# Patient Record
Sex: Male | Born: 1970 | Race: Black or African American | Hispanic: No | Marital: Single | Smoking: Never smoker
Health system: Southern US, Community
[De-identification: ages and names within clinical notes are randomized; demographics above are authoritative.]

## PROBLEM LIST (undated history)

## (undated) DIAGNOSIS — I10 Essential (primary) hypertension: Secondary | ICD-10-CM

## (undated) DIAGNOSIS — I480 Paroxysmal atrial fibrillation: Secondary | ICD-10-CM

## (undated) HISTORY — DX: Paroxysmal atrial fibrillation: I48.0

---

## 2016-04-19 ENCOUNTER — Emergency Department: Payer: 59

## 2016-04-19 ENCOUNTER — Encounter: Payer: Self-pay | Admitting: Medical Oncology

## 2016-04-19 ENCOUNTER — Emergency Department
Admission: EM | Admit: 2016-04-19 | Discharge: 2016-04-19 | Disposition: A | Discharge status: Home / Self Care | Payer: 59 | Attending: Emergency Medicine | Admitting: Emergency Medicine

## 2016-04-19 DIAGNOSIS — I10 Essential (primary) hypertension: Secondary | ICD-10-CM | POA: Diagnosis not present

## 2016-04-19 DIAGNOSIS — R079 Chest pain, unspecified: Secondary | ICD-10-CM | POA: Insufficient documentation

## 2016-04-19 DIAGNOSIS — Z79899 Other long term (current) drug therapy: Secondary | ICD-10-CM | POA: Diagnosis not present

## 2016-04-19 HISTORY — DX: Essential (primary) hypertension: I10

## 2016-04-19 LAB — CBC
HCT: 39.7 % — ABNORMAL LOW (ref 40.0–52.0)
HEMOGLOBIN: 13.2 g/dL (ref 13.0–18.0)
MCH: 26.5 pg (ref 26.0–34.0)
MCHC: 33.1 g/dL (ref 32.0–36.0)
MCV: 80 fL (ref 80.0–100.0)
Platelets: 263 10*3/uL (ref 150–440)
RBC: 4.96 MIL/uL (ref 4.40–5.90)
RDW: 13.6 % (ref 11.5–14.5)
WBC: 5 10*3/uL (ref 3.8–10.6)

## 2016-04-19 LAB — BASIC METABOLIC PANEL
Anion gap: 8 (ref 5–15)
BUN: 17 mg/dL (ref 6–20)
CHLORIDE: 103 mmol/L (ref 101–111)
CO2: 27 mmol/L (ref 22–32)
Calcium: 9.5 mg/dL (ref 8.9–10.3)
Creatinine, Ser: 1.34 mg/dL — ABNORMAL HIGH (ref 0.61–1.24)
GFR calc non Af Amer: >60 mL/min (ref >60)
Glucose, Bld: 93 mg/dL (ref 65–99)
Potassium: 3.7 mmol/L (ref 3.5–5.1)
Sodium: 138 mmol/L (ref 135–145)

## 2016-04-19 LAB — TROPONIN I
Troponin I: <0.03 ng/mL (ref <0.03)
Troponin I: <0.03 ng/mL (ref <0.03)

## 2016-04-19 MED ORDER — OXYCODONE-ACETAMINOPHEN 5-325 MG PO TABS
1.0000 | ORAL_TABLET | Freq: Once | ORAL | Status: AC
  Administered 2016-04-19: 1 via ORAL
  Filled 2016-04-19: qty 1

## 2016-04-19 NOTE — ED Provider Notes (Signed)
[facility] Emergency Department Provider Note  ____________________________________________   First MD Initiated Contact with Patient 04/19/16 1111     (approximate)  I have reviewed the triage vital signs and the nursing notes.   HISTORY  Chief Complaint Chest Pain and Headache   HPI Aaron Tucker is a 46 y.o. male With a history of atrial fibrillation on Xarelto who is presenting to the emergency department today with left-sided chest pain.He says the pain started when he was shoveling dirt while "digging a line." He says the pain is sharp and a 6 out of 10 at this time. He says that it is associated with shortness of breath and radiates to the left upper arm. He says that he has had intermittent episodes of this type of pain as well as intermittent headaches over the past year. He says that he is a strong history of heart disease in his family including people including people who have heart attacks their 2740s.  He patient is denying headache at this time. Denying nausea at this time.Says that he has taken Xarelto up until yesterday but did not take his morning dose because he just ran out of medication.says the pain started at about 9 AM this morning.does not report pain worsening with exertion.   Past Medical History:  Diagnosis Date  . A-fib (HCC)   . Hypertension     There are no active problems to display for this patient.   No past surgical history on file.  Prior to Admission medications   Medication Sig Start Date End Date Taking? Authorizing Provider  amLODipine (NORVASC) 10 MG tablet Take 10 mg by mouth daily. 03/12/16  Yes Historical Provider, MD  carvedilol (COREG) 12.5 MG tablet Take 12.5 mg by mouth 2 (two) times daily with a meal. 03/13/16  Yes Historical Provider, MD  lisinopril-hydrochlorothiazide (PRINZIDE,ZESTORETIC) 20-25 MG tablet Take 1 tablet by mouth daily. 03/12/16  Yes Historical Provider, MD  XARELTO 20 MG TABS tablet Take 20 mg by  mouth daily. 03/14/16  Yes Historical Provider, MD    Allergies Patient has no known allergies.  No family history on file.  Social History Social History  Substance Use Topics  . Smoking status: Not on file  . Smokeless tobacco: Not on file  . Alcohol use Not on file    Review of Systems Constitutional: No fever/chills Eyes: No visual changes. ENT: No sore throat. Cardiovascular: as above Respiratory: as above Gastrointestinal: No abdominal pain.  No nausea, no vomiting.  No diarrhea.  No constipation. Genitourinary: Negative for dysuria. Musculoskeletal: Negative for back pain. Skin: Negative for rash. Neurological: Negative for headaches, focal weakness or numbness.  10-point ROS otherwise negative.  ____________________________________________   PHYSICAL EXAM:  VITAL SIGNS: ED Triage Vitals  Enc Vitals Group     BP 04/19/16 1025 129/82     Pulse Rate 04/19/16 1025 70     Resp 04/19/16 1025 20     Temp 04/19/16 1025 98.3 F (36.8 C)     Temp Source 04/19/16 1025 Oral     SpO2 04/19/16 1025 99 %     Weight 04/19/16 1026 230 lb (104.3 kg)     Height 04/19/16 1026 5\' 10"  (1.778 m)     Head Circumference --      Peak Flow --      Pain Score 04/19/16 1029 6     Pain Loc --      Pain Edu? --      Excl.  in GC? --     Constitutional: Alert and oriented. Well appearing and in no acute distress. Eyes: Conjunctivae are normal. PERRL. EOMI. Head: Atraumatic. Nose: No congestion/rhinnorhea. Mouth/Throat: Mucous membranes are moist.   Neck: No stridor.   Cardiovascular: Normal rate, regular rhythm. Grossly normal heart sounds.  Good peripheral circulation with equal and intact bilaterally radial pulses. Chest pain is reproducible palpation over the left pectoralis major muscle. Respiratory: Normal respiratory effort.  No retractions. Lungs CTAB. Gastrointestinal: Soft and nontender. No distention.  Musculoskeletal: No lower extremity tenderness nor edema.  No joint  effusions. Neurologic:  Normal speech and language. No gross focal neurologic deficits are appreciated Skin:  Skin is warm, dry and intact. No rash noted. Psychiatric: Mood and affect are normal. Speech and behavior are normal.  ____________________________________________   LABS (all labs ordered are listed, but only abnormal results are displayed)  Labs Reviewed  BASIC METABOLIC PANEL - Abnormal; Notable for the following:       Result Value   Creatinine, Ser 1.34 (*)    All other components within normal limits  CBC - Abnormal; Notable for the following:    HCT 39.7 (*)    All other components within normal limits  TROPONIN I  TROPONIN I   ____________________________________________  EKG  ED ECG REPORT I, Arelia Longest, the attending physician, personally viewed and interpreted this ECG.   Date: 04/19/2016  EKG Time: 1021  Rate: 68  Rhythm: normal sinus rhythm  Axis: normal  Intervals:none  ST&T Change: Mildly elevated ST segments diffusely consistent with early repolarization. No abnormal T-wave inversions.  ____________________________________________  RADIOLOGY  DG Chest 2 View (Final result)  Result time 04/19/16 10:53:11  Final result by Richarda Overlie, MD (04/19/16 10:53:11)           Narrative:   CLINICAL DATA: Headache and chest pain.  EXAM: CHEST 2 VIEW  COMPARISON: None.  FINDINGS: Both lungs are clear. Heart and mediastinum are within normal limits. The trachea is midline. Bony thorax is intact.  IMPRESSION: No active cardiopulmonary disease.   Electronically Signed By: Richarda Overlie M.D. On: 04/19/2016 10:53            ____________________________________________   PROCEDURES  Procedure(s) performed:   Procedures  Critical Care performed:   ____________________________________________   INITIAL IMPRESSION / ASSESSMENT AND PLAN / ED COURSE  Pertinent labs & imaging results that were available during my care  of the patient were reviewed by me and considered in my medical decision making (see chart for details).  PERC negative.    Clinical Course as of Apr 19 1499  Thu Apr 19, 2016  1339 Patient with relief of pain after Percocet. Very reassuring first round of lab testing. We will check a second troponin. If negative, the patient will be discharged home with cardiology follow-up. I also recommended Aspercreme and I see hot for further local relief of the pain which appears to be chest wall pain. However, given the patient's concerning personal cardiac history as well as family history he will be referred for outpatient follow-up with cardiology.  [DS]    Clinical Course User Index [DS] Myrna Blazer, MD   ----------------------------------------- 3:02 PM on 04/19/2016 -----------------------------------------  Patient is still feeling much improved. Very reassuring workup including a normal EKG and 2 negative troponins. Tenderness to the chest wall and patient taking a ditch when this pain started. Feel this is most likely a chest wall pain. However, the patient does not a  cardiologist and is on anticoagulants for atrial fibrillation. I'll give him follow-up with the on-call cardiologist. He knows he must follow-up within the next 72 hours. Patient understands this plan and is willing to comply. We also discussed using Aspercreme or icy hot to the affected area for further pain relief.  ____________________________________________   FINAL CLINICAL IMPRESSION(S) / ED DIAGNOSES  Chest pain.    NEW MEDICATIONS STARTED DURING THIS VISIT:  New Prescriptions   No medications on file     Note:  This document was prepared using Dragon voice recognition software and may include unintentional dictation errors.    Myrna Blazer, MD 04/19/16 [phone]

## 2016-04-19 NOTE — ED Notes (Signed)
Patient sitting in waiting room, using his phone.  Denies any new complaints at this time.  Advised to let us know if his symptoms worsened.

## 2016-04-19 NOTE — ED Triage Notes (Signed)
Pt reports that he has been having left sided chest pain for over a year with headaches off and on. Pt reports feeling sob when pain hits.

## 2016-05-03 DIAGNOSIS — I480 Paroxysmal atrial fibrillation: Secondary | ICD-10-CM

## 2016-05-03 HISTORY — DX: Paroxysmal atrial fibrillation: I48.0

## 2016-05-03 HISTORY — PX: NM MYOVIEW LTD: HXRAD82

## 2016-07-03 HISTORY — PX: LEFT HEART CATH AND CORONARY ANGIOGRAPHY: CATH118249

## 2018-01-14 ENCOUNTER — Telehealth: Payer: Self-pay

## 2018-01-14 NOTE — Telephone Encounter (Signed)
SENT REFERRAL TO SCHEDULING, NO NOTES 

## 2018-02-17 ENCOUNTER — Ambulatory Visit: Payer: 59 | Admitting: Cardiology

## 2018-02-17 ENCOUNTER — Encounter: Payer: Self-pay | Admitting: Cardiology

## 2018-02-17 VITALS — BP 130/80 | HR 69 | Ht 70.0 in | Wt 228.4 lb

## 2018-02-17 DIAGNOSIS — I480 Paroxysmal atrial fibrillation: Secondary | ICD-10-CM | POA: Diagnosis not present

## 2018-02-17 DIAGNOSIS — R9431 Abnormal electrocardiogram [ECG] [EKG]: Secondary | ICD-10-CM

## 2018-02-17 DIAGNOSIS — R0789 Other chest pain: Secondary | ICD-10-CM | POA: Insufficient documentation

## 2018-02-17 DIAGNOSIS — I10 Essential (primary) hypertension: Secondary | ICD-10-CM

## 2018-02-17 MED ORDER — NITROGLYCERIN 0.4 MG SL SUBL: 0.4000 mg | SUBLINGUAL_TABLET | SUBLINGUAL | 3 refills | Status: AC | PRN

## 2018-02-17 NOTE — Progress Notes (Signed)
PCP: Patient, No Pcp Per   Ivonne Andrew, FNP -- Dorothy Puffer  [phone]   Clinic Note: Chief Complaint  Patient presents with  . New Patient (Initial Visit)    Establish cardiology care.  . Chest Pain    LEFT SIDED  . Atrial Fibrillation    History (March 2018)    HPI:  Aaron Tucker is a 47 y.o. male with a past medical history of hypertension and paroxysmal A. fib who is being seen today for the evaluation of Left Sided Chest Pain at the request of Touchstone, Eugenio Hoes, FNP (from [place]).  Aaron Tucker was referred by Ms. Touchstone, FNP -- Aaron Tucker PCP down in [place].   Recent Hospitalizations:   None since Feb 2019 -admitted ([facility]) for chest pain evaluation.  Eventually had a Myoview stress test followed by cardiac catheterization (see below).  Studies Personally Reviewed - (if available, images/films reviewed: From Epic Chart or Care Everywhere)  Myoview 05/2016 [facility]): Mild inducible perfusion defect of the anterior and anteroseptal wall.  Sub-target heart rate.  CATH 07/2016 - ([facility]) Normal Coronary Arteries.  High LVEDP.  (False Positive Stress Test)  Interval History: Aaron Tucker presents here today for evaluation of episodic sharp left-sided chest pain that can come and go at any times of the day.  Not necessarily associate with any particular activity.  Aaron Tucker describes it as a sharp pain that can last 10 seconds to 10 minutes.  Last episode Aaron Tucker had was about 10 weeks ago.  (Aaron Tucker girlfriend indicates that these are associated with him being sweaty and short of breath and and just not looking well.  Aaron Tucker himself really does not describe this).  Symptoms are not made worse by getting up and walking around or with any kind of exertion.  Aaron Tucker denies any sensation of rapid irregular heartbeats with these episodes to suggest that this is related to A. fib.  Aaron Tucker is familiar with Aaron Tucker A. fib symptoms and has not noted any recurrence of that for a while.  Aaron Tucker said Aaron Tucker was  diagnosed with A. fib in [place] prior to Aaron Tucker hospitalization [facility] for chest pain.  The chest pain Aaron Tucker was evaluated for last year was very similar to the current symptoms.  Aaron Tucker denies any PND, orthopnea or edema.  No syncope/near syncope or TIA/amaurosis fugax.  No melena, hematochezia, hematuria, or epstaxis. No claudication.  Aaron Tucker has not had a breakthrough episode of A. fib since the first diagnosis.  Aaron Tucker is been on aspirin and beta-blocker.  Aaron Tucker denies having had a echo or stress test done at the time of Aaron Tucker diagnosis, but did have a stress test and cath done shortly after and a different institution.  ROS: A comprehensive was performed.  Pertinent symptoms noted above Review of Systems  Constitutional: Negative for malaise/fatigue and weight loss.  HENT: Negative for congestion.   Respiratory: Positive for shortness of breath (Chest pain spells can take Aaron Tucker breath away, but otherwise no obvious dyspnea.). Negative for cough and wheezing.   Cardiovascular: Negative for claudication.  Gastrointestinal: Negative for abdominal pain, heartburn, nausea and vomiting.  Musculoskeletal: Positive for joint pain and myalgias (Occasional nighttime cramping).  Neurological: Negative for dizziness, focal weakness, weakness and headaches.  Psychiatric/Behavioral: Negative.   All other systems reviewed and are negative.  I have reviewed and (if needed) personally updated the patient's problem list, medications, allergies, past medical and surgical history, social and family history.   Past Medical History:  Diagnosis Date  .  Hypertension   . Paroxysmal atrial fibrillation (HCC) 05/2016    Past Surgical History:  Procedure Laterality Date  . LEFT HEART CATH AND CORONARY ANGIOGRAPHY  07/2016   [facility] - normal coronaries.  Normal EF.  Elevated LVEDP  . NM MYOVIEW LTD  05/2016   [facility] - Abnormal - ? Anterior, Anteroseptal Ischemia --> FALSE POSITIVE (normal cors on cath)    Current Meds    Medication Sig  . amLODipine (NORVASC) 10 MG tablet Take 10 mg by mouth daily.  . carvedilol (COREG) 12.5 MG tablet Take 12.5 mg by mouth 2 (two) times daily with a meal.  . lisinopril-hydrochlorothiazide (PRINZIDE,ZESTORETIC) 20-25 MG tablet Take 1 tablet by mouth daily.    No Known Allergies  Social History   Tobacco Use  . Smoking status: Never Smoker  . Smokeless tobacco: Never Used  Substance Use Topics  . Alcohol use: Not on file  . Drug use: Not on file   Social History   Social History Narrative   Aaron Tucker lives with Aaron Tucker girlfriend.  The moved from [place] about 2 years ago, but Aaron Tucker has been going back and forth to [place] for Aaron Tucker PCP evaluations.   Aaron Tucker has 4 children.     Drinks about a 12 pack a week.     Aaron Tucker does a lot of exercise/activity at work, but does not do routine exercise.    family history includes Asthma in Aaron Tucker brother, daughter, sister, and son; Diabetes in Aaron Tucker mother; High blood pressure in Aaron Tucker mother.  Wt Readings from Last 3 Encounters:  02/17/18 228 lb 6.4 oz (103.6 kg)  04/19/16 230 lb (104.3 kg)    PHYSICAL EXAM BP 130/80 (BP Location: Right Arm, Patient Position: Sitting, Cuff Size: Large)   Pulse 69   Ht 5\' 10"  (1.778 m)   Wt 228 lb 6.4 oz (103.6 kg)   BMI 32.77 kg/m  Physical Exam  Constitutional: Aaron Tucker is oriented to person, place, and time. Aaron Tucker appears well-developed and well-nourished. No distress.  Healthy-appearing.  Well-groomed.  Mildly obese.  HENT:  Head: Normocephalic and atraumatic.  Mouth/Throat: Oropharynx is clear and moist.  Eyes: Pupils are equal, round, and reactive to light. Conjunctivae and EOM are normal.  Neck: Neck supple. No hepatojugular reflux and no JVD present. Carotid bruit is not present. No tracheal deviation present. No thyromegaly present.  Cardiovascular: Normal rate, regular rhythm, S1 normal, S2 normal, intact distal pulses and normal pulses.  No extrasystoles are present. PMI is not displaced. Exam  reveals gallop, S4 (Cannot exclude soft S4) and distant heart sounds.  No murmur heard. Pulmonary/Chest: Effort normal and breath sounds normal. No respiratory distress. Aaron Tucker has no wheezes. Aaron Tucker has no rales. Aaron Tucker exhibits tenderness (Definitely has tenderness along the left sternal border.  Reproducible pain on exam).  Abdominal: Soft. Bowel sounds are normal. Aaron Tucker exhibits no distension. There is no abdominal tenderness. There is no rebound.  Obese.  Unable to assess HSM  Musculoskeletal: Normal range of motion.        General: No edema.  Neurological: Aaron Tucker is alert and oriented to person, place, and time. No cranial nerve deficit.  Skin: Skin is warm and dry. No rash noted. No erythema.  Psychiatric: Aaron Tucker has a normal mood and affect. Aaron Tucker behavior is normal. Judgment and thought content normal.  Vitals reviewed.    Adult ECG Report  Rate: 69 ;  Rhythm: normal sinus rhythm and LVH with repolarization changes consistent;   Narrative Interpretation: Borderline abnormal EKG  Other studies Reviewed: Additional studies/ records that were reviewed today include:  Recent Labs:  None available    ASSESSMENT / PLAN: Problem List Items Addressed This Visit    Abnormal EKG (Chronic)    LVH with repolarization noted on EKG.  Would like to get a baseline assessment of Aaron Tucker cardiac function and chamber sizes.  Need to exclude hypertensive hypertrophic cardiomyopathy.  Plan: 2D echo      Relevant Orders   EKG 12-Lead (Completed)   Comprehensive metabolic panel (Completed)   Lipid panel (Completed)   Hemoglobin A1c (Completed)   ECHOCARDIOGRAM COMPLETE   Chest wall pain - Primary    Sharp left-sided chest pain which is somewhat reproducible on exam.  Does not sound cardiac in nature. However Aaron Tucker does have some and more findings on Aaron Tucker EKG, therefore with relatively young gentleman with hypertension and A. fib, will check an echocardiogram just to assess Aaron Tucker EF and for any evidence of potential  hypertrophic cardiomyopathy. . Interestingly, Aaron Tucker girlfriend made more of an issue of Aaron Tucker chest pain.  She stated that these episodes last about 5 to 10 minutes and Aaron Tucker becomes sweaty and clammy.  As such, we will provide prescription for nitroglycerin to see if this would help. These were similar symptoms that were evaluated last year with a stress test which was false positive and a catheter was negative.  I am reluctant to do another ischemic evaluation for similar symptoms.      Relevant Orders   EKG 12-Lead (Completed)   Comprehensive metabolic panel (Completed)   Lipid panel (Completed)   Hemoglobin A1c (Completed)   Essential hypertension (Chronic)    Blood pressures pretty well controlled on current dose of amlodipine, carvedilol and lisinopril-HCTZ.  Refill meds. We will check chemistry panel.      Relevant Medications   nitroGLYCERIN (NITROSTAT) 0.4 MG SL tablet   Other Relevant Orders   EKG 12-Lead (Completed)   Comprehensive metabolic panel (Completed)   Lipid panel (Completed)   Hemoglobin A1c (Completed)   ECHOCARDIOGRAM COMPLETE   PAF (paroxysmal atrial fibrillation) (HCC) (Chronic)    History of PAF, but as far as Aaron Tucker can tell, no recurrence since March or May 2018.  Aaron Tucker did have some chest discomfort episodes when Aaron Tucker had A. fib, and therefore would probably know when Aaron Tucker is gone in and out of it.  With no obvious evidence of recurrence, I think were probably safer just doing baby aspirin for stroke prophylaxis.  Continue beta-blocker at current dose for rate control. We could consider pill in the pocket if Aaron Tucker has recurrence.  This patients CHA2DS2-VASc Score and unadjusted Ischemic Stroke Rate (% per year) is equal to 0.6 % stroke rate/year from a score of 1  Above score calculated as 1 point each if present [CHF, HTN, DM, Vascular=MI/PAD/Aortic Plaque, Age if 65-74, or Male]; 2 points each if present [Age > 75, or Stroke/TIA/TE]       Relevant Medications    nitroGLYCERIN (NITROSTAT) 0.4 MG SL tablet   Other Relevant Orders   EKG 12-Lead (Completed)   Comprehensive metabolic panel (Completed)   Lipid panel (Completed)   Hemoglobin A1c (Completed)     Pretty much since Aaron Tucker has not had any evaluation since May 2018, we will go ahead and check baseline labs with chemistry panel, lipid panel, and hemoglobin A1c.  I spent a total of 40 minutes with the patient and chart review. >  50% of the time was spent in direct patient consultation.  Additional 5 to 10 minutes spent on chart review reviewing prior cath and stress test results.  Current medicines are reviewed at length with the patient today.  (+/- concerns) none The following changes have been made:  None  Patient Instructions  Medication Instructions:  Take sublingual Nitroglycerin AS NEEDED  If you need a refill on your cardiac medications before your next appointment, please call your pharmacy.   Lab work: Please return for FASTING labs (CMET,Lipid, HmgA1C)  Our in office lab hours are Monday-Friday 8:00-4:00, closed for lunch 12:45-1:45 pm.  No appointment needed.  If you have labs (blood work) drawn today and your tests are completely normal, you will receive your results only by: Marland Kitchen. MyChart Message (if you have MyChart) OR . A paper copy in the mail If you have any lab test that is abnormal or we need to change your treatment, we will call you to review the results.  Testing/Procedures: Your physician has requested that you have an echocardiogram. Echocardiography is a painless test that uses sound waves to create images of your heart. It provides your doctor with information about the size and shape of your heart and how well your heart's chambers and valves are working. This procedure takes approximately one hour. There are no restrictions for this procedure. This [facility] be done at our [facility] location:  [employer]  Follow-Up: At [facility], you and  your health needs are our priority.  As part of our continuing mission to provide you with exceptional heart care, we have created designated Provider Care Teams.  These Care Teams include your primary Cardiologist (physician) and Advanced Practice Providers (APPs -  Physician Assistants and Nurse Practitioners) who all work together to provide you with the care you need, when you need it. You will need a follow up appointment in 2 months.  Please call our office 2 months in advance to schedule this appointment.  You may see Bryan Lemmaavid , MD or one of the following Advanced Practice Providers on your designated Care Team:   Theodore DemarkRhonda Barrett, PA-C . Joni ReiningKathryn Lawrence, DNP, ANP      Studies Ordered:   Orders Placed This Encounter  Procedures  . Comprehensive metabolic panel  . Lipid panel  . Hemoglobin A1c  . EKG 12-Lead  . ECHOCARDIOGRAM COMPLETE      Bryan Lemmaavid , M.D., M.S. Interventional Cardiologist   Pager # [phone] Phone # [phone] [address] [place], [place] [address]   Thank you for choosing [facility] at [facility]!!

## 2018-02-17 NOTE — Patient Instructions (Addendum)
Medication Instructions:  Take sublingual Nitroglycerin AS NEEDED  If you need a refill on your cardiac medications before your next appointment, please call your pharmacy.   Lab work: Please return for FASTING labs (CMET,Lipid, HmgA1C)  Our in office lab hours are Monday-Friday 8:00-4:00, closed for lunch 12:45-1:45 pm.  No appointment needed.  If you have labs (blood work) drawn today and your tests are completely normal, you will receive your results only by: Marland Kitchen. MyChart Message (if you have MyChart) OR . A paper copy in the mail If you have any lab test that is abnormal or we need to change your treatment, we will call you to review the results.  Testing/Procedures: Your physician has requested that you have an echocardiogram. Echocardiography is a painless test that uses sound waves to create images of your heart. It provides your doctor with information about the size and shape of your heart and how well your heart's chambers and valves are working. This procedure takes approximately one hour. There are no restrictions for this procedure. [facility] at [facility]:  [employer]  Follow-Up: At [facility], you and your health needs are our priority.  As part of our continuing mission to provide you with exceptional heart care, we have created designated Provider Care Teams.  These Care Teams include your primary Cardiologist (physician) and Advanced Practice Providers (APPs -  Physician Assistants and Nurse Practitioners) who all work together to provide you with the care you need, when you need it. You will need a follow up appointment in 2 months.  Please call our office 2 months in advance to schedule this appointment.  You may see Bryan Lemmaavid Harding, MD or one of the following Advanced Practice Providers on your designated Care Team:   Theodore DemarkRhonda Barrett, PA-C . Joni ReiningKathryn Lawrence, DNP, ANP

## 2018-02-18 ENCOUNTER — Encounter: Payer: Self-pay | Admitting: Cardiology

## 2018-02-18 DIAGNOSIS — I10 Essential (primary) hypertension: Secondary | ICD-10-CM | POA: Insufficient documentation

## 2018-02-18 DIAGNOSIS — R9431 Abnormal electrocardiogram [ECG] [EKG]: Secondary | ICD-10-CM | POA: Insufficient documentation

## 2018-02-18 LAB — COMPREHENSIVE METABOLIC PANEL
ALK PHOS: 65 IU/L (ref 39–117)
ALT: 34 IU/L (ref 0–44)
AST: 28 IU/L (ref 0–40)
Albumin/Globulin Ratio: 1.6 (ref 1.2–2.2)
Albumin: 4.4 g/dL (ref 3.5–5.5)
BUN/Creatinine Ratio: 11 (ref 9–20)
BUN: 14 mg/dL (ref 6–24)
Bilirubin Total: 0.3 mg/dL (ref 0.0–1.2)
CHLORIDE: 100 mmol/L (ref 96–106)
CO2: 24 mmol/L (ref 20–29)
CREATININE: 1.26 mg/dL (ref 0.76–1.27)
Calcium: 9.9 mg/dL (ref 8.7–10.2)
GFR calc Af Amer: 80 mL/min/{1.73_m2} (ref >59)
GFR calc non Af Amer: 69 mL/min/{1.73_m2} (ref >59)
GLOBULIN, TOTAL: 2.7 g/dL (ref 1.5–4.5)
GLUCOSE: 90 mg/dL (ref 65–99)
Potassium: 4.4 mmol/L (ref 3.5–5.2)
Sodium: 139 mmol/L (ref 134–144)
Total Protein: 7.1 g/dL (ref 6.0–8.5)

## 2018-02-18 LAB — LIPID PANEL
CHOLESTEROL TOTAL: 194 mg/dL (ref 100–199)
Chol/HDL Ratio: 4.7 ratio (ref 0.0–5.0)
HDL: 41 mg/dL (ref >39)
LDL CALC: 125 mg/dL — AB (ref 0–99)
TRIGLYCERIDES: 138 mg/dL (ref 0–149)
VLDL Cholesterol Cal: 28 mg/dL (ref 5–40)

## 2018-02-18 LAB — HEMOGLOBIN A1C
Est. average glucose Bld gHb Est-mCnc: 117 mg/dL
Hgb A1c MFr Bld: 5.7 % — ABNORMAL HIGH (ref 4.8–5.6)

## 2018-02-18 NOTE — Assessment & Plan Note (Signed)
Blood pressures pretty well controlled on current dose of amlodipine, carvedilol and lisinopril-HCTZ.  Refill meds. We will check chemistry panel.

## 2018-02-18 NOTE — Assessment & Plan Note (Addendum)
LVH with repolarization noted on EKG.  Would like to get a baseline assessment of his cardiac function and chamber sizes.  Need to exclude hypertensive hypertrophic cardiomyopathy.  Plan: 2D echo

## 2018-02-18 NOTE — Assessment & Plan Note (Addendum)
Sharp left-sided chest pain which is somewhat reproducible on exam.  Does not sound cardiac in nature. However he does have some and more findings on his EKG, therefore with relatively young gentleman with hypertension and A. fib, will check an echocardiogram just to assess his EF and for any evidence of potential hypertrophic cardiomyopathy. . Interestingly, his girlfriend made more of an issue of his chest pain.  She stated that these episodes last about 5 to 10 minutes and he becomes sweaty and clammy.  As such, we will provide prescription for nitroglycerin to see if this would help. These were similar symptoms that were evaluated last year with a stress test which was false positive and a catheter was negative.  I am reluctant to do another ischemic evaluation for similar symptoms.

## 2018-02-18 NOTE — Assessment & Plan Note (Signed)
History of PAF, but as far as he can tell, no recurrence since March or May 2018.  He did have some chest discomfort episodes when he had A. fib, and therefore would probably know when he is gone in and out of it.  With no obvious evidence of recurrence, I think were probably safer just doing baby aspirin for stroke prophylaxis.  Continue beta-blocker at current dose for rate control. We could consider pill in the pocket if he has recurrence.  This patients CHA2DS2-VASc Score and unadjusted Ischemic Stroke Rate (% per year) is equal to 0.6 % stroke rate/year from a score of 1  Above score calculated as 1 point each if present [CHF, HTN, DM, Vascular=MI/PAD/Aortic Plaque, Age if 65-74, or Male]; 2 points each if present [Age > 75, or Stroke/TIA/TE]

## 2018-02-20 ENCOUNTER — Other Ambulatory Visit (HOSPITAL_COMMUNITY): Payer: 59

## 2018-02-20 DIAGNOSIS — R0989 Other specified symptoms and signs involving the circulatory and respiratory systems: Secondary | ICD-10-CM

## 2018-02-27 ENCOUNTER — Other Ambulatory Visit (HOSPITAL_COMMUNITY): Payer: 59

## 2018-03-07 ENCOUNTER — Other Ambulatory Visit (HOSPITAL_COMMUNITY): Payer: 59

## 2018-03-07 DIAGNOSIS — R0989 Other specified symptoms and signs involving the circulatory and respiratory systems: Secondary | ICD-10-CM

## 2018-03-12 ENCOUNTER — Encounter: Payer: Self-pay | Admitting: Cardiology

## 2018-03-20 ENCOUNTER — Telehealth: Payer: Self-pay

## 2018-03-20 NOTE — Telephone Encounter (Signed)
RN Call patient - no answer , did not leave message thanks

## 2018-03-20 NOTE — Telephone Encounter (Signed)
New message    Just an FYI. We have made several attempts to contact this patient including sending a letter to schedule or reschedule their echocardiogram. We will be removing the patient from the echo WQ.   Thank you 

## 2018-04-21 ENCOUNTER — Ambulatory Visit: Payer: 59 | Admitting: Cardiology

## 2018-04-21 DIAGNOSIS — R0989 Other specified symptoms and signs involving the circulatory and respiratory systems: Secondary | ICD-10-CM

## 2018-04-22 ENCOUNTER — Encounter: Payer: Self-pay | Admitting: *Deleted

## 2018-05-20 ENCOUNTER — Other Ambulatory Visit: Payer: Self-pay

## 2018-05-20 ENCOUNTER — Encounter (HOSPITAL_COMMUNITY): Payer: Self-pay | Admitting: Obstetrics and Gynecology

## 2018-05-20 ENCOUNTER — Emergency Department (HOSPITAL_COMMUNITY)
Admission: EM | Admit: 2018-05-20 | Discharge: 2018-05-20 | Disposition: A | Discharge status: Home / Self Care | Payer: 59 | Attending: Emergency Medicine | Admitting: Emergency Medicine

## 2018-05-20 ENCOUNTER — Emergency Department (HOSPITAL_COMMUNITY): Payer: 59

## 2018-05-20 DIAGNOSIS — S161XXA Strain of muscle, fascia and tendon at neck level, initial encounter: Secondary | ICD-10-CM | POA: Diagnosis not present

## 2018-05-20 DIAGNOSIS — Y999 Unspecified external cause status: Secondary | ICD-10-CM | POA: Diagnosis not present

## 2018-05-20 DIAGNOSIS — Y929 Unspecified place or not applicable: Secondary | ICD-10-CM | POA: Diagnosis not present

## 2018-05-20 DIAGNOSIS — I10 Essential (primary) hypertension: Secondary | ICD-10-CM | POA: Insufficient documentation

## 2018-05-20 DIAGNOSIS — M62830 Muscle spasm of back: Secondary | ICD-10-CM | POA: Insufficient documentation

## 2018-05-20 DIAGNOSIS — Z79899 Other long term (current) drug therapy: Secondary | ICD-10-CM | POA: Insufficient documentation

## 2018-05-20 DIAGNOSIS — Y9389 Activity, other specified: Secondary | ICD-10-CM | POA: Diagnosis not present

## 2018-05-20 DIAGNOSIS — S199XXA Unspecified injury of neck, initial encounter: Secondary | ICD-10-CM | POA: Diagnosis present

## 2018-05-20 MED ORDER — NAPROXEN 500 MG PO TABS: 500.0000 mg | ORAL_TABLET | Freq: Two times a day (BID) | ORAL | 0 refills | Status: AC

## 2018-05-20 MED ORDER — CYCLOBENZAPRINE HCL 10 MG PO TABS
10.0000 mg | ORAL_TABLET | Freq: Once | ORAL | Status: AC
  Administered 2018-05-20: 10 mg via ORAL
  Filled 2018-05-20: qty 1

## 2018-05-20 MED ORDER — CYCLOBENZAPRINE HCL 10 MG PO TABS: 10.0000 mg | ORAL_TABLET | Freq: Two times a day (BID) | ORAL | 0 refills | Status: AC | PRN

## 2018-05-20 MED ORDER — OXYCODONE-ACETAMINOPHEN 5-325 MG PO TABS
1.0000 | ORAL_TABLET | Freq: Once | ORAL | Status: AC
  Administered 2018-05-20: 1 via ORAL
  Filled 2018-05-20: qty 1

## 2018-05-20 NOTE — Discharge Instructions (Addendum)
Do not drive while taking the muscle relaxer as it will make you sleepy. Follow up with your doctor or return here for worsening symptoms.
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
